# Patient Record
Sex: Male | Born: 1985 | Hispanic: Yes | Marital: Single | Smoking: Never smoker
Health system: Southern US, Community
[De-identification: ages and names within clinical notes are randomized; demographics above are authoritative.]

---

## 2015-08-09 ENCOUNTER — Encounter (HOSPITAL_COMMUNITY): Payer: Self-pay

## 2015-08-09 ENCOUNTER — Emergency Department (HOSPITAL_COMMUNITY): Payer: Worker's Compensation

## 2015-08-09 ENCOUNTER — Emergency Department (HOSPITAL_COMMUNITY)
Admission: EM | Admit: 2015-08-09 | Discharge: 2015-08-09 | Disposition: A | Discharge status: Home / Self Care | Payer: Worker's Compensation | Attending: Emergency Medicine | Admitting: Emergency Medicine

## 2015-08-09 DIAGNOSIS — W1789XA Other fall from one level to another, initial encounter: Secondary | ICD-10-CM | POA: Insufficient documentation

## 2015-08-09 DIAGNOSIS — S4991XA Unspecified injury of right shoulder and upper arm, initial encounter: Secondary | ICD-10-CM | POA: Diagnosis not present

## 2015-08-09 DIAGNOSIS — Y998 Other external cause status: Secondary | ICD-10-CM

## 2015-08-09 DIAGNOSIS — M79601 Pain in right arm: Secondary | ICD-10-CM

## 2015-08-09 DIAGNOSIS — Y9389 Activity, other specified: Secondary | ICD-10-CM

## 2015-08-09 DIAGNOSIS — S4992XA Unspecified injury of left shoulder and upper arm, initial encounter: Secondary | ICD-10-CM | POA: Insufficient documentation

## 2015-08-09 DIAGNOSIS — Y9289 Other specified places as the place of occurrence of the external cause: Secondary | ICD-10-CM

## 2015-08-09 DIAGNOSIS — M79602 Pain in left arm: Secondary | ICD-10-CM

## 2015-08-09 MED ORDER — IBUPROFEN 400 MG PO TABS
600.0000 mg | ORAL_TABLET | Freq: Once | ORAL | Status: AC
  Administered 2015-08-09: 600 mg via ORAL
  Filled 2015-08-09: qty 1

## 2015-08-09 MED ORDER — IBUPROFEN 600 MG PO TABS: 600.0000 mg | ORAL_TABLET | Freq: Three times a day (TID) | ORAL | Status: AC | PRN

## 2015-08-09 NOTE — ED Notes (Addendum)
Pt reports falling from top of construction mold approx 7 feet, landing on concrete floor.  Pt reports landing on bilat hands.Pt denies hitting head, denies LOC, denies neck or back pain.  Pt reports pain with movement.  Neuro, pulses intact.

## 2015-08-09 NOTE — ED Notes (Signed)
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 Patient reports that he attempted to catch himself during the fall, no loc.

## 2015-08-09 NOTE — ED Provider Notes (Signed)
CSN: [id number]     Arrival date & time 08/09/15  0830 History   First MD Initiated Contact with Patient 08/09/15 [phone]     Chief Complaint  Patient presents with  . bilateral arm pain   . Fall     HPI Patient states that earlier this morning he fell down into an stuck his arms out in front of them broke his fall.  He reports pain from his bilateral wrist his bilateral shoulders.  He is able to bend his wrists elbows and shoulders without difficulty.  He states the pain is more of a tingling pain.  He denies neck pain or back pain.  He denies weakness of his hands and arms.  He is able to lift his arms over his head.  He is brought to the emergency department at the recommendation of his employer.  He denies head injury.  Denies loss conscious.   History reviewed. No pertinent past medical history. History reviewed. No pertinent past surgical history. No family history on file. Social History  Substance Use Topics  . Smoking status: Never Smoker   . Smokeless tobacco: None  . Alcohol Use: None    Review of Systems  All other systems reviewed and are negative.     Allergies  Review of patient's allergies indicates not on file.  Home Medications   Prior to Admission medications   Medication Sig Start Date End Date Taking? Authorizing Provider  ibuprofen (ADVIL,MOTRIN) 600 MG tablet Take 1 tablet (600 mg total) by mouth every 8 (eight) hours as needed. 08/09/15   Azalia Bilis, MD   BP 155/100 mmHg  Pulse 75  Temp(Src) 98.5 F (36.9 C) (Oral)  Resp 20  Wt 220 lb (99.791 kg)  SpO2 96% Physical Exam  Constitutional: He is oriented to person, place, and time. He appears well-developed and well-nourished.  HENT:  Head: Normocephalic and atraumatic.  Eyes: EOM are normal.  Neck: Normal range of motion. Neck supple.  Full range of motion of neck.  No C-spine tenderness or paracervical tenderness.  Cardiovascular: Normal rate, regular rhythm, normal heart sounds and intact distal  pulses.   Pulmonary/Chest: Effort normal and breath sounds normal. No respiratory distress.  Abdominal: Soft. He exhibits no distension. There is no tenderness.  Musculoskeletal: Normal range of motion.  Normal grip strength bilaterally.  Full range of motion bilateral wrists, elbows, shoulders.  Able to pronate and supinate both hands without difficulty.  No thoracic or lumbar tenderness.  Normal strength of lower extremity's.  Ambulatory.  Neurological: He is alert and oriented to person, place, and time.  Skin: Skin is warm and dry.  Psychiatric: He has a normal mood and affect. Judgment normal.  Nursing note and vitals reviewed.   ED Course  Procedures (including critical care time) Labs Review Labs Reviewed - No data to display  Imaging Review Dg Elbow Complete Left  08/09/2015  CLINICAL DATA:  Pain following fall EXAM: LEFT ELBOW - COMPLETE 3+ VIEW COMPARISON:  None. FINDINGS: Frontal, lateral, and bilateral oblique views were obtained. There is no demonstrable fracture or dislocation. No joint effusion. The joint spaces appear intact. No erosive change. IMPRESSION: No demonstrable fracture or dislocation. No appreciable arthropathy. Electronically Signed   By: Bretta Bang III M.D.   On: 08/09/2015 09:52   Dg Elbow Complete Right  08/09/2015  CLINICAL DATA:  Pain following fall EXAM: RIGHT ELBOW - COMPLETE 3+ VIEW COMPARISON:  None. FINDINGS: Frontal, lateral, and bilateral oblique views were obtained. There  is no demonstrable fracture or dislocation. No joint effusion. Joint spaces appear intact. No erosive change. IMPRESSION: No fracture or dislocation.  No appreciable arthropathic change. Electronically Signed   By: Bretta Bang III M.D.   On: 08/09/2015 09:53   I have personally reviewed and evaluated these images and lab results as part of my medical decision-making.   EKG Interpretation None      MDM   Final diagnoses:  Bilateral arm pain    Discharge home in  good condition.  X-rays of his elbows are negative and this is where he is complaining of the majority of his pain.  No paresthesias at this time.  Normal grip strength bilaterally.  No neck or back pain.  He understands to return to the ER for new or worsening symptoms    Azalia Bilis, MD 08/09/15 1031

## 2015-08-09 NOTE — ED Notes (Signed)
This RN consulted with Dr. Patria Mane, who advised pt to return to work after 08-10-15.

## 2017-03-05 IMAGING — DX DG ELBOW COMPLETE 3+V*R*
4 series · 4 of 4 positions shown · non-contrast
Comparison: None.

CLINICAL DATA: Pain following fall

EXAM:
RIGHT ELBOW - COMPLETE 3+ VIEW

[x elbow ap right]
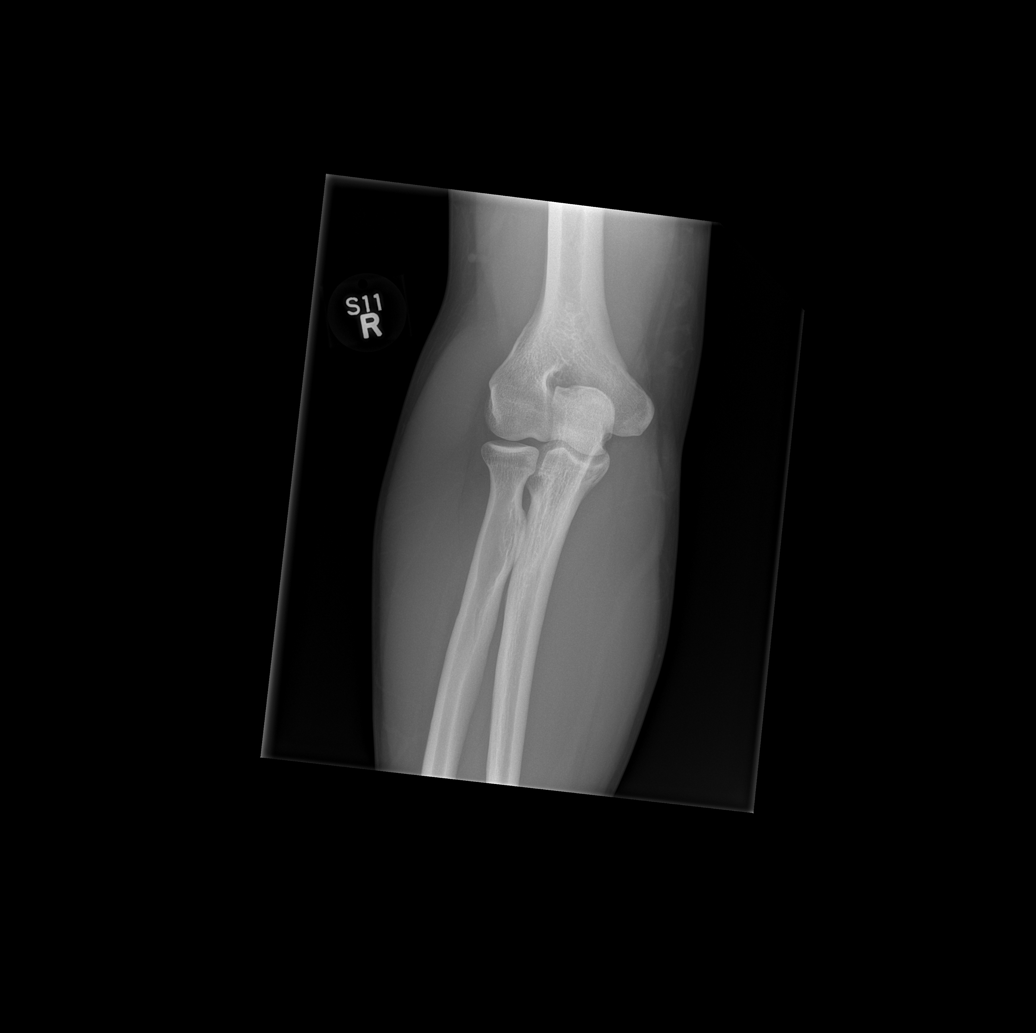

[x elbow obl right (1 of 2)]
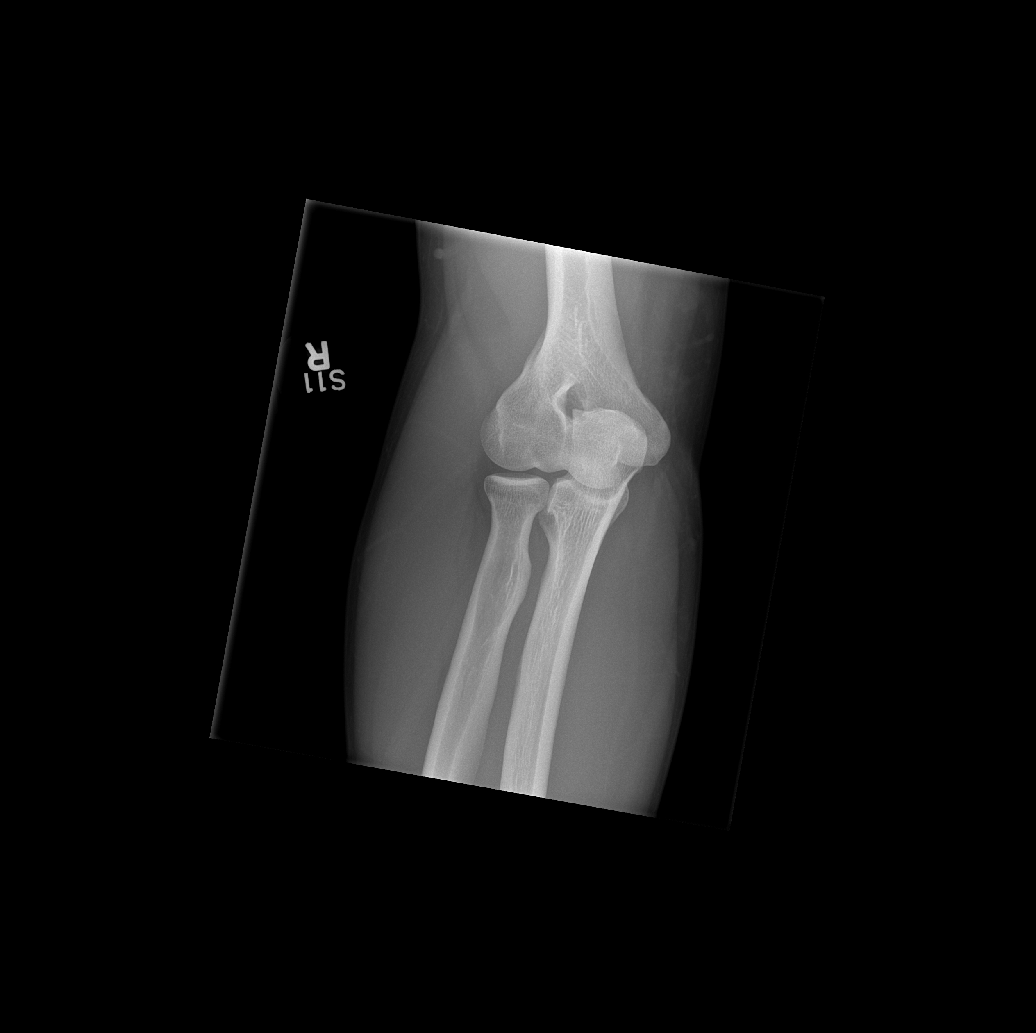

[x elbow obl right (2 of 2)]
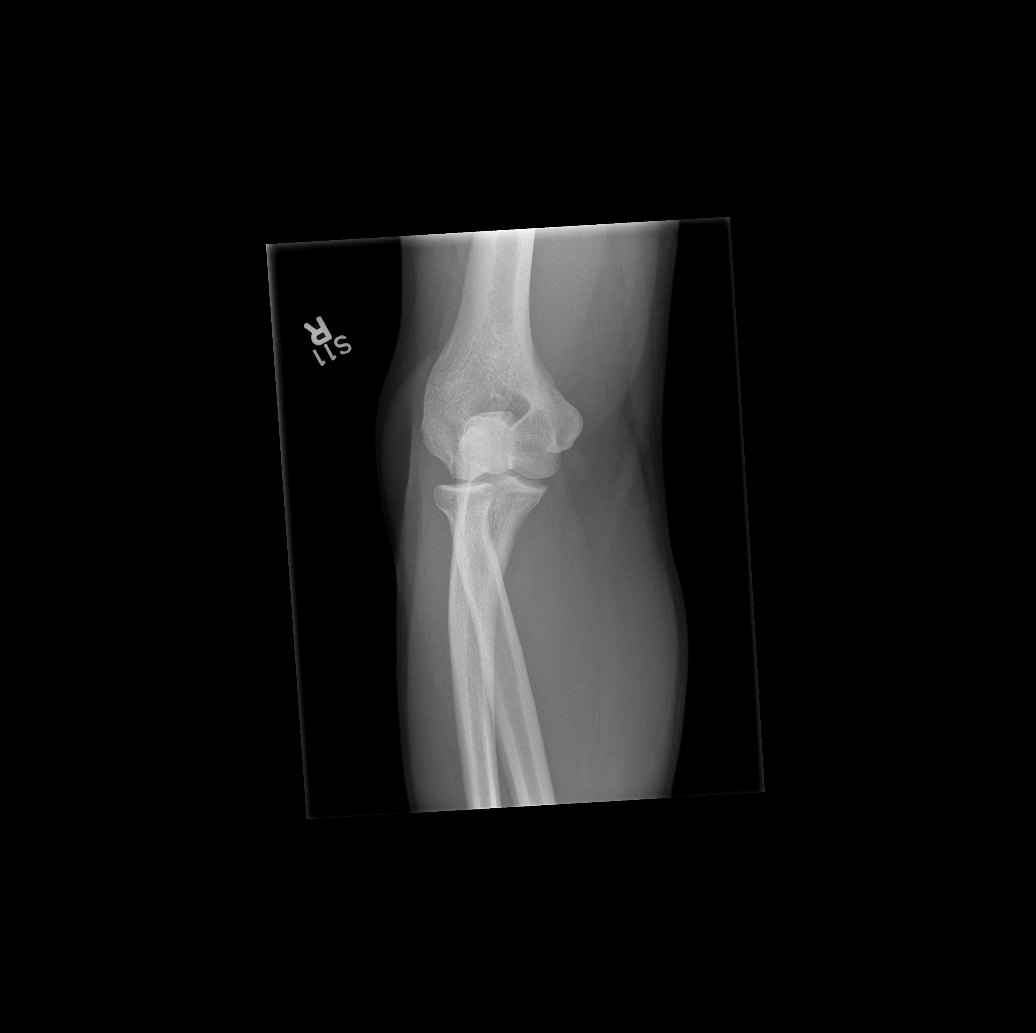

[x elbow lat right]
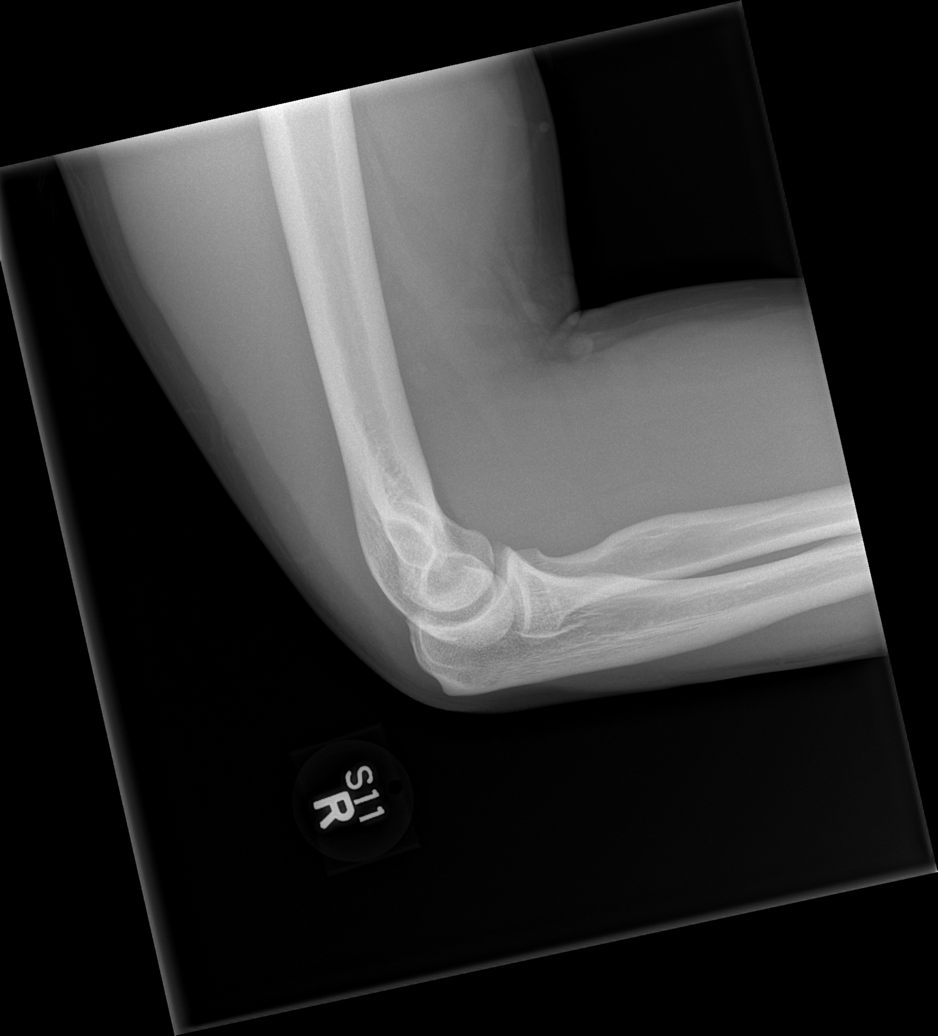

[4 of 4 positions shown; findings below may reference images not displayed]

FINDINGS: Frontal, lateral, and bilateral oblique views were obtained. There
is no demonstrable fracture or dislocation. No joint effusion. Joint
spaces appear intact. No erosive change.
IMPRESSION: No fracture or dislocation.  No appreciable arthropathic change.

## 2017-03-05 IMAGING — DX DG ELBOW COMPLETE 3+V*L*
4 series · 4 of 4 positions shown · non-contrast
Comparison: None.

CLINICAL DATA: Pain following fall

EXAM:
LEFT ELBOW - COMPLETE 3+ VIEW

[x elbow ap left]
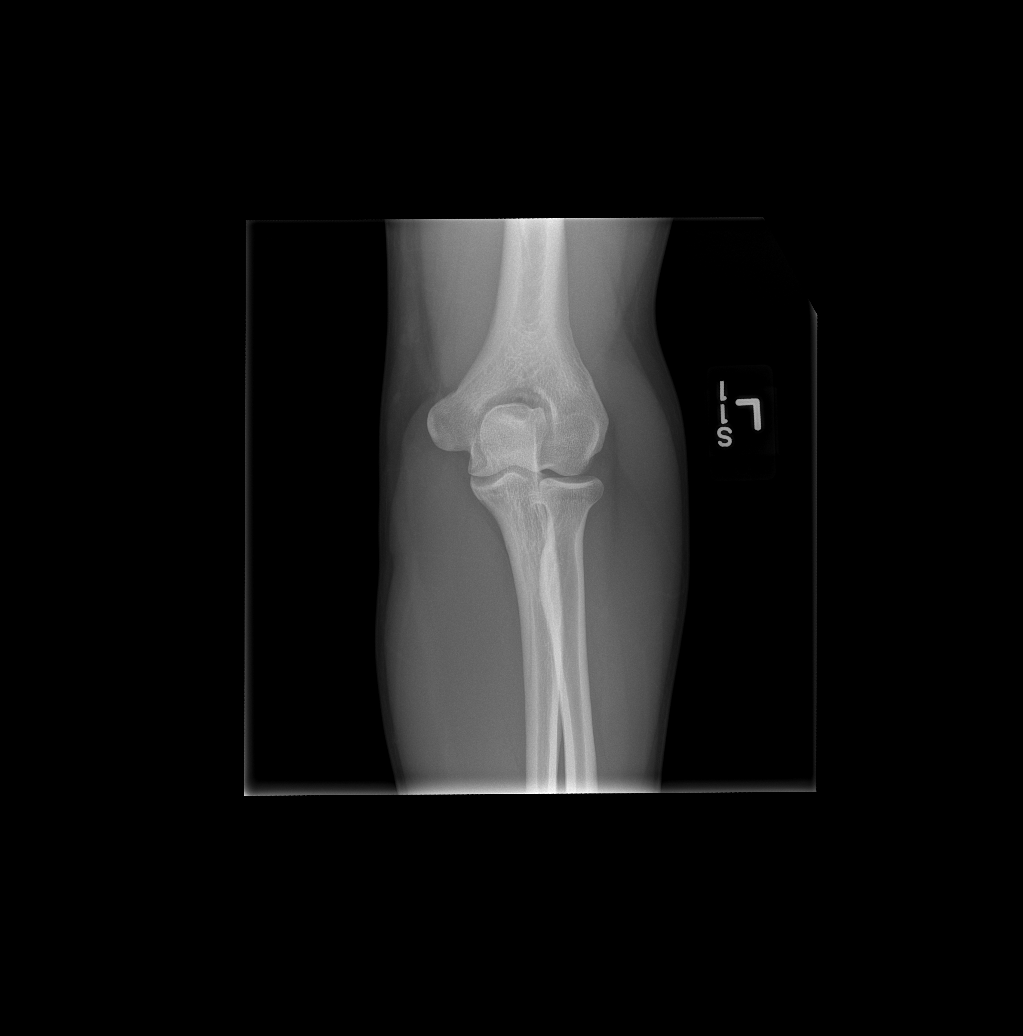

[x elbow obl left (1 of 2)]
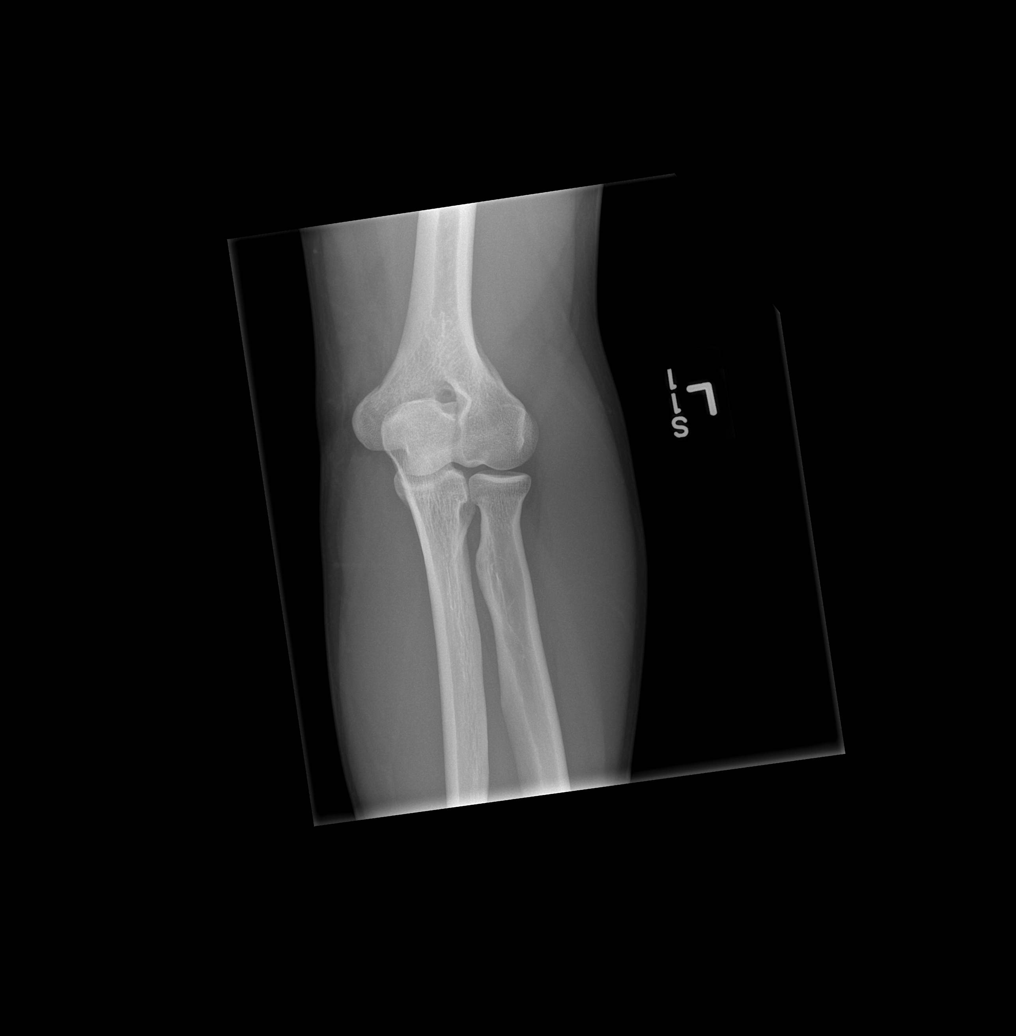

[x elbow obl left (2 of 2)]
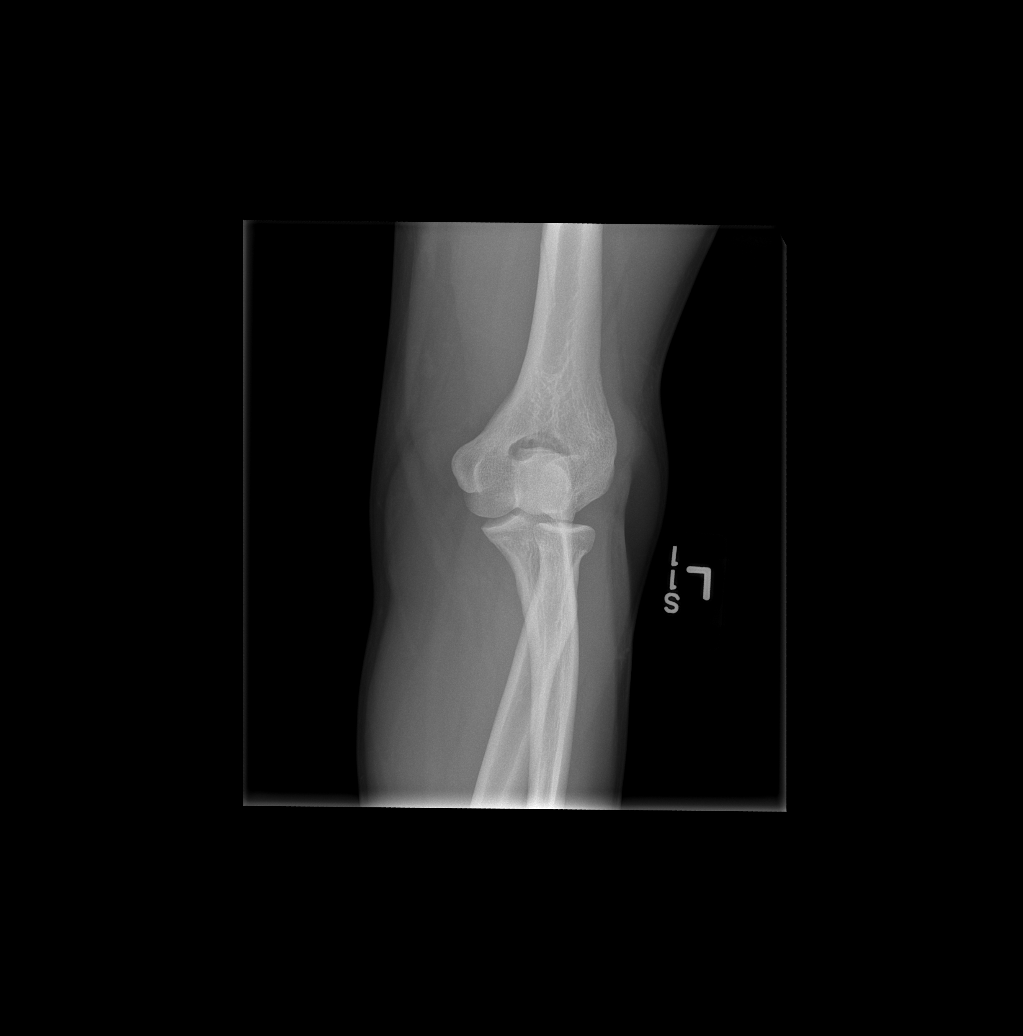

[x elbow lat left]
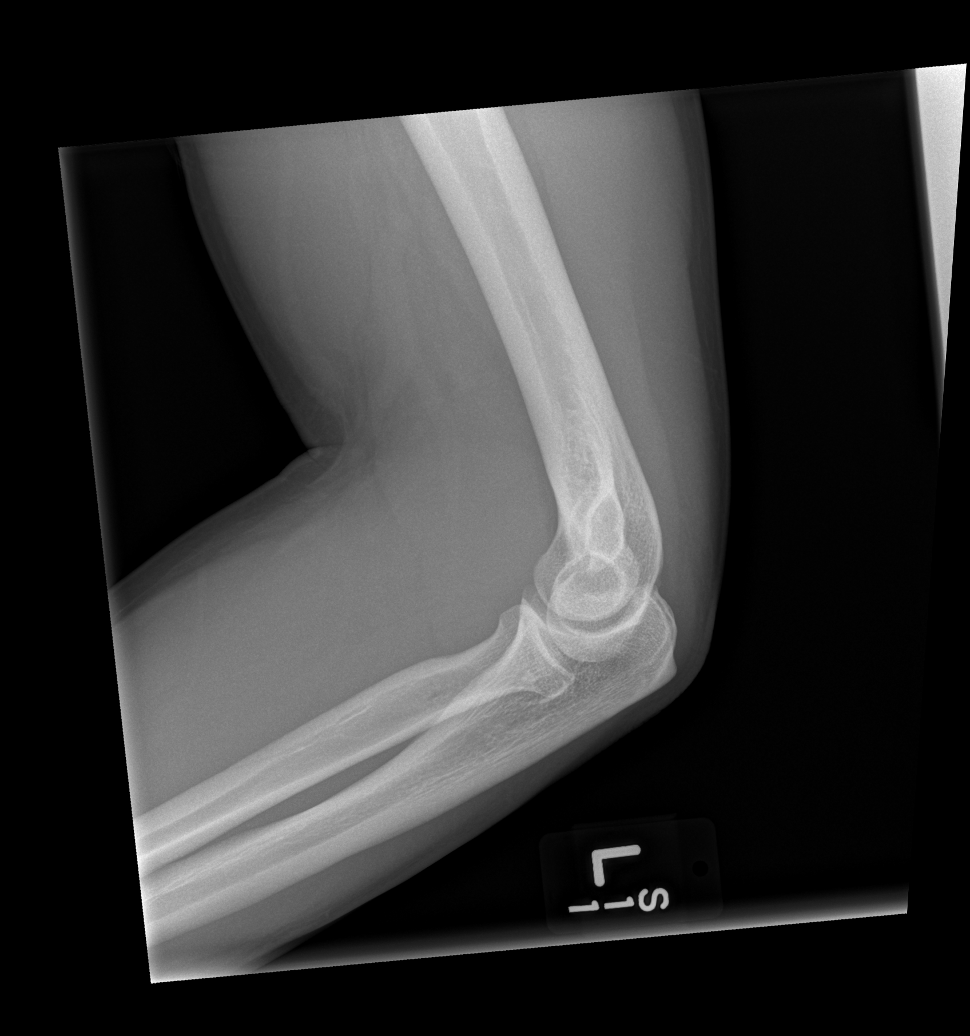

[4 of 4 positions shown; findings below may reference images not displayed]

FINDINGS: Frontal, lateral, and bilateral oblique views were obtained. There
is no demonstrable fracture or dislocation. No joint effusion. The
joint spaces appear intact. No erosive change.
IMPRESSION: No demonstrable fracture or dislocation. No appreciable arthropathy.
